# Patient Record
Sex: Male | Born: 2000 | Hispanic: Yes | Marital: Single | Smoking: Never smoker
Health system: Southern US, Community
[De-identification: ages and names within clinical notes are randomized; demographics above are authoritative.]

---
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
## 2021-01-29 ENCOUNTER — Other Ambulatory Visit: Payer: Self-pay

## 2021-01-29 ENCOUNTER — Emergency Department (HOSPITAL_COMMUNITY)
Admission: EM | Admit: 2021-01-29 | Discharge: 2021-01-29 | Disposition: A | Discharge status: Home / Self Care | Payer: Self-pay | Attending: Emergency Medicine | Admitting: Emergency Medicine

## 2021-01-29 ENCOUNTER — Encounter (HOSPITAL_COMMUNITY): Payer: Self-pay

## 2021-01-29 DIAGNOSIS — X58XXXA Exposure to other specified factors, initial encounter: Secondary | ICD-10-CM | POA: Insufficient documentation

## 2021-01-29 DIAGNOSIS — R58 Hemorrhage, not elsewhere classified: Secondary | ICD-10-CM

## 2021-01-29 DIAGNOSIS — S3021XA Contusion of penis, initial encounter: Secondary | ICD-10-CM | POA: Insufficient documentation

## 2021-01-29 NOTE — Discharge Instructions (Signed)
You have a bruise on your penis.  This will not affect the function of the penis.  If other bruises start coming up, see your primary care provider or return to the emergency department to have evaluation to find out why they are coming up.

## 2021-01-29 NOTE — ED Triage Notes (Signed)
pov from home cc of ruptured blood vessel on side of penis that appeared this am. He noticed it when he went to the restroom. Denies injury

## 2021-01-29 NOTE — ED Provider Notes (Signed)
[facility] EMERGENCY DEPARTMENT Provider Note   CSN: [id number] Arrival date & time: 01/29/21  0277   History Chief Complaint  Patient presents with  . Groin Swelling    Adam Jackson is a 20 y.o. male.  The history is provided by the patient.  He woke up to urinate and noticed a bruise on his penis.  He denies any trauma.  He denies any other bruising.  He denies any pain.  He is not on aspirin and not on any anticoagulants.  No past medical history on file.  There are no problems to display for this patient.   No past surgical history on file.     No family history on file.  Social History   Tobacco Use  . Smoking status: Never Smoker  Substance Use Topics  . Alcohol use: No  . Drug use: No    Home Medications Prior to Admission medications   Medication Sig Start Date End Date Taking? Authorizing Provider  diphenhydrAMINE (BENADRYL) 25 MG tablet Take 1 tablet (25 mg total) by mouth every 4 (four) hours as needed for itching. 07/02/14   Triplett, Tammy, PA-C  permethrin (ELIMITE) 5 % cream Apply 1 application topically once.    [provider]  predniSONE (DELTASONE) 20 MG tablet Take 1 tablet (20 mg total) by mouth 2 (two) times daily with a meal. For 5 days 07/02/14   Pauline Aus, PA-C    Allergies    Patient has no known allergies.  Review of Systems   Review of Systems  All other systems reviewed and are negative.   Physical Exam Updated Vital Signs BP (!) 108/92   Temp 98.2 F (36.8 C)   Resp 18   Ht 5\' 7"  (1.702 m)   Wt 78.5 kg   SpO2 100%   BMI 27.10 kg/m   Physical Exam Vitals and nursing note reviewed.   20 year old male, resting comfortably and in no acute distress. Vital signs are normal. Oxygen saturation is 100%, which is normal. Head is normocephalic and atraumatic. PERRLA, EOMI. Oropharynx is clear. Neck is nontender and supple without adenopathy or JVD. Back is nontender and there is no CVA tenderness. Lungs are  clear without rales, wheezes, or rhonchi. Chest is nontender. Heart has regular rate and rhythm without murmur. Abdomen is soft, flat, nontender without masses or hepatosplenomegaly and peristalsis is normoactive. Genitalia: Uncircumcised penis.  Ecchymosis is noted on the dorsum of the prepuce.  There is no phimosis or paraphimosis.  No inguinal adenopathy. Extremities have no cyanosis or edema, full range of motion is present. Skin is warm and dry without rash. Neurologic: Mental status is normal, cranial nerves are intact, there are no motor or sensory deficits.  ED Results / Procedures / Treatments    Procedures Procedures   Medications Ordered in ED Medications - No data to display  ED Course  I have reviewed the triage vital signs and the nursing notes.  MDM Rules/Calculators/A&P Spontaneous ecchymosis.  No evidence of purpura.  At this point, I do not feel any investigation is warranted, but patient advised to follow-up with PCP or return to emergency department if additional spontaneous ecchymoses develop.  Also, referred to urology.  Old records are reviewed, and he has no relevant past visits.  Final Clinical Impression(s) / ED Diagnoses Final diagnoses:  Ecchymosis on examination    Rx / DC Orders ED Discharge Orders    None       26, MD 01/29/21  0347  

## 2021-10-27 ENCOUNTER — Emergency Department (HOSPITAL_COMMUNITY)
Admission: EM | Admit: 2021-10-27 | Discharge: 2021-10-27 | Disposition: A | Discharge status: Home / Self Care | Payer: Self-pay | Attending: Emergency Medicine | Admitting: Emergency Medicine

## 2021-10-27 ENCOUNTER — Encounter (HOSPITAL_COMMUNITY): Payer: Self-pay | Admitting: *Deleted

## 2021-10-27 ENCOUNTER — Other Ambulatory Visit: Payer: Self-pay

## 2021-10-27 ENCOUNTER — Encounter (HOSPITAL_COMMUNITY): Payer: Self-pay

## 2021-10-27 DIAGNOSIS — L03113 Cellulitis of right upper limb: Secondary | ICD-10-CM | POA: Insufficient documentation

## 2021-10-27 DIAGNOSIS — W57XXXA Bitten or stung by nonvenomous insect and other nonvenomous arthropods, initial encounter: Secondary | ICD-10-CM | POA: Insufficient documentation

## 2021-10-27 DIAGNOSIS — L03114 Cellulitis of left upper limb: Secondary | ICD-10-CM | POA: Insufficient documentation

## 2021-10-27 MED ORDER — CEPHALEXIN 500 MG PO CAPS: 500.0000 mg | ORAL_CAPSULE | Freq: Three times a day (TID) | ORAL | 0 refills | Status: DC

## 2021-10-27 MED ORDER — CEPHALEXIN 500 MG PO CAPS
500.0000 mg | ORAL_CAPSULE | Freq: Once | ORAL | Status: AC
  Administered 2021-10-27: 500 mg via ORAL
  Filled 2021-10-27: qty 1

## 2021-10-27 NOTE — Discharge Instructions (Signed)
Return to ED with any new symptoms such as fevers, nausea, vomiting, diarrhea It will take a few days to see relief Take benadryl 25mg  every 6 hours for relief of itching You may also take 10mg  of famotidine which is over the counter Please see attached educational pamphlet for further information regarding cellulitis Continue to take antibiotics as prescribed

## 2021-10-27 NOTE — Discharge Instructions (Signed)
Take diphenhydramine (Benadryl) as needed for any burning sensation.  Take ibuprofen and/your acetaminophen as needed for pain.  Return if the redness is spreading, or if you start developing difficulty breathing, or if you start running a high fever.

## 2021-10-27 NOTE — ED Provider Notes (Signed)
[facility] EMERGENCY DEPARTMENT Provider Note   CSN: [id number] Arrival date & time: 10/27/21  0026     History  Chief complaint: Insect bites  Adam Jackson is a 21 y.o. male.  The history is provided by the patient.  He has no significant past history, and woke up with red spots on his right forearm and left shoulder.  He thinks they may be insect bites.  He complains of burning pain on the.  He has tried applying alcohol which does give some slight, temporary relief.  He denies fever or chills.  They are mildly painful.  He denies any difficulty breathing or swallowing.   Home Medications Prior to Admission medications   Medication Sig Start Date End Date Taking? Authorizing Provider  diphenhydrAMINE (BENADRYL) 25 MG tablet Take 1 tablet (25 mg total) by mouth every 4 (four) hours as needed for itching. 07/02/14 01/29/21  Triplett, Babette Relic, PA-C      Allergies    Patient has no known allergies.    Review of Systems   Review of Systems  All other systems reviewed and are negative.  Physical Exam Updated Vital Signs BP 129/86    Pulse 68    Temp 98.6 F (37 C) (Oral)    Resp 17    Ht 5\' 7"  (1.702 m)    Wt 78.5 kg    SpO2 100%    BMI 27.11 kg/m  Physical Exam Vitals and nursing note reviewed.  21 year old male, resting comfortably and in no acute distress. Vital signs are normal. Oxygen saturation is 100%, which is normal. Head is normocephalic and atraumatic. PERRLA, EOMI. Oropharynx is clear. Neck is nontender and supple without adenopathy or JVD. Back is nontender and there is no CVA tenderness. Lungs are clear without rales, wheezes, or rhonchi. Chest is nontender. Heart has regular rate and rhythm without murmur. Abdomen is soft, flat, nontender . Extremities have no cyanosis or edema, full range of motion is present.  There are areas of erythema on the right forearm and left shoulder.  These are slightly raised and slightly warm to the touch without lymphangitic  streaks.  There is no fluctuance.  Right forearm erythematous area measures 6 x 9 cm.  Left shoulder erythematous area measures 4 x 5 cm. Skin is warm and dry without other rash. Neurologic: Mental status is normal, cranial nerves are intact, moves all extremities equally.  ED Results / Procedures / Treatments    Procedures Ultrasound ED Soft Tissue  Date/Time: 10/27/2021 3:13 AM Performed by: 12/25/2021, MD Authorized by: Dione Booze, MD   Procedure details:    Indications: evaluate for cellulitis     Transverse view:  Visualized   Longitudinal view:  Visualized   Images: archived   Location:    Location: upper extremity     Side:  Right Findings:     no abscess present    cellulitis present Ultrasound ED Soft Tissue  Date/Time: 10/27/2021 3:14 AM Performed by: 12/25/2021, MD Authorized by: Dione Booze, MD   Procedure details:    Indications: evaluate for cellulitis     Transverse view:  Visualized   Longitudinal view:  Visualized   Images: archived   Location:    Location: upper extremity     Side:  Left Findings:     no abscess present    cellulitis present    Medications Ordered in ED Medications  cephALEXin (KEFLEX) capsule 500 mg (has no administration in time range)  ED Course/ Medical Decision Making/ A&P                           Medical Decision Making  Areas of erythema and warmth appear to be cellulitis.  No evidence of actual arthropod bite.  Bedside ultrasound done to evaluate for possible early abscess, no abscess identified but cellulitis identified in both locations.  Old records are reviewed, and he has no relevant past visits.  He is given a dose of cephalexin and discharged with prescription for same, told to use diphenhydramine for burning sensation, acetaminophen or ibuprofen for pain.  Return precautions discussed.        Final Clinical Impression(s) / ED Diagnoses Final diagnoses:  Cellulitis of right forearm  Cellulitis of  left shoulder    Rx / DC Orders ED Discharge Orders          Ordered    cephALEXin (KEFLEX) 500 MG capsule  3 times daily        10/27/21 0306              Dione Booze, MD 10/27/21 [phone]

## 2021-10-27 NOTE — ED Provider Notes (Addendum)
[facility] DEPT Provider Note   CSN: [id number] Arrival date & time: 10/27/21  1618     History  Chief Complaint  Patient presents with   ? abscess to rt FA    Adam Jackson is a 21 y.o. male evaluated in triage. Patient here due to area of cellulitis on right forearm as well as left shoulder. Patient was seen for this same complaint this morning by Dr. Roxanne Mins and diagnosed with cellulitis and prescribed Keflex. Patient states he has taken his first dose of Keflex but has returned here today because arm has began to feel "heavy". When asked what this means the patient states that the arm feels "fuller, and it is burning". Patient admits to scratching the area earlier today. Patient states he has attempted to take benadryl this morning which did provide temporary relief but the pain has returned. Patient also had ultrasound of area conducted earlier this morning by Dr. Roxanne Mins which showed no signs of fluctuance or fluid collection.   HPI     Home Medications Prior to Admission medications   Medication Sig Start Date End Date Taking? Authorizing Provider  cephALEXin (KEFLEX) 500 MG capsule Take 1 capsule (500 mg total) by mouth 3 (three) times daily. [id number]   Delora Fuel, MD  diphenhydrAMINE (BENADRYL) 25 MG tablet Take 1 tablet (25 mg total) by mouth every 4 (four) hours as needed for itching. 07/02/14 01/29/21  Triplett, Lynelle Smoke, PA-C      Allergies    Patient has no known allergies.    Review of Systems   Review of Systems  Skin:        Area of erythema to left forearm and right shoulder.  All other systems reviewed and are negative.  Physical Exam Updated Vital Signs BP (!) 141/96 (BP Location: Left Arm)    Pulse 86    Temp 98 F (36.7 C) (Oral)    Resp 16    Ht 5\' 7"  (1.702 m)    Wt 78.5 kg    SpO2 100%    BMI 27.11 kg/m  Physical Exam Vitals and nursing note reviewed.  Constitutional:      General: He is not in acute distress.     Appearance: Normal appearance. He is normal weight. He is not ill-appearing or toxic-appearing.  HENT:     Head: Normocephalic and atraumatic.     Nose: Nose normal.     Mouth/Throat:     Mouth: Mucous membranes are moist.  Eyes:     Pupils: Pupils are equal, round, and reactive to light.  Cardiovascular:     Rate and Rhythm: Normal rate and regular rhythm.  Pulmonary:     Effort: Pulmonary effort is normal.     Breath sounds: Normal breath sounds. No wheezing.  Abdominal:     General: Abdomen is flat.     Palpations: Abdomen is soft.     Tenderness: There is no abdominal tenderness.  Musculoskeletal:     Cervical back: Normal range of motion.  Skin:    General: Skin is warm and dry.     Capillary Refill: Capillary refill takes less than 2 seconds.     Comments: Extremities show no cyanosis or edema, full range of motion is present.  There are areas of erythema on the right forearm and left shoulder.  These are slightly raised and slightly warm to the touch without lymphangitic streaks.  There is no fluctuance.  Right forearm erythematous area measures 6 x 9  cm.  Left shoulder erythematous area measures 4 x 5 cm  Neurological:     General: No focal deficit present.     Mental Status: He is alert.    ED Results / Procedures / Treatments   Labs (all labs ordered are listed, but only abnormal results are displayed) Labs Reviewed - No data to display  EKG None  Radiology No results found.  Procedures Procedures    Medications Ordered in ED Medications - No data to display  ED Course/ Medical Decision Making/ A&P                           Medical Decision Making  Plan for this patient will remain the same as it was this morning.  Area has already received ultrasound this morning which was unrevealing of any abnormality.  The patient has been prescribed Keflex and advised to take benadryl and famotidine for symptomatic relief. Patient has been educated and informed  that it will take atleast a day to experience symptomatic relief of areas he is currently complaining of. Patient has been advised to treat symptoms utilizing benadryl and warm compresses. Patient will be given educational material here prior to discharge. I have given the patient return precautions to include systemic signs of infection, he voices understanding. The patient will be discharged and he is in agreement with the plan. The patient is stable on discharge.  Final Clinical Impression(s) / ED Diagnoses Final diagnoses:  Cellulitis of right forearm  Cellulitis of left shoulder    Rx / DC Orders ED Discharge Orders     None           Azucena Cecil, PA-C 10/27/21 1654    Godfrey Pick, MD 10/31/21 0145

## 2021-10-27 NOTE — ED Triage Notes (Signed)
Pov from home with cc of bug bite to his right elbow and left shoulder. Noticed it this morning. Reports burning at locations.

## 2021-10-27 NOTE — ED Triage Notes (Signed)
Returns today due to redness and decreased sensation to rt elbow

## 2022-01-06 ENCOUNTER — Emergency Department (HOSPITAL_COMMUNITY)
Admission: EM | Admit: 2022-01-06 | Discharge: 2022-01-06 | Disposition: A | Discharge status: Home / Self Care | Payer: Self-pay | Attending: Emergency Medicine | Admitting: Emergency Medicine

## 2022-01-06 ENCOUNTER — Encounter (HOSPITAL_COMMUNITY): Payer: Self-pay | Admitting: Emergency Medicine

## 2022-01-06 ENCOUNTER — Emergency Department (HOSPITAL_COMMUNITY): Payer: Self-pay

## 2022-01-06 DIAGNOSIS — S060X0A Concussion without loss of consciousness, initial encounter: Secondary | ICD-10-CM

## 2022-01-06 DIAGNOSIS — S0993XA Unspecified injury of face, initial encounter: Secondary | ICD-10-CM

## 2022-01-06 MED ORDER — NAPROXEN 500 MG PO TABS: 500.0000 mg | ORAL_TABLET | Freq: Two times a day (BID) | ORAL | 0 refills | Status: AC
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
## 2022-01-06 NOTE — Discharge Instructions (Signed)
Please take Naprosyn, 500mg  by mouth twice daily as needed for pain - this in an antiinflammatory medicine (NSAID) and is similar to ibuprofen - many people feel that it is stronger than ibuprofen and it is easier to take since it is a smaller pill.  Please use this only for 1 week - if your pain persists, you will need to follow up with your doctor in the office for ongoing guidance and pain control.   ? ?Your CT scans do not show any signs of acute injuries or fracture, there is no bleeding on the brain, there is no fracture of your face or your jaw or your teeth. ? ?You will likely get better over the next couple of days and the bruising will go down. ?
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
